# Patient Record
Sex: Female | Born: 1995 | Race: Black or African American | Hispanic: No | Marital: Single | Smoking: Never smoker
Health system: Southern US, Community
[De-identification: ages and names within clinical notes are randomized; demographics above are authoritative.]

---

## 2014-02-24 ENCOUNTER — Other Ambulatory Visit (HOSPITAL_COMMUNITY): Payer: Self-pay | Admitting: Nurse Practitioner

## 2014-02-24 DIAGNOSIS — Z3689 Encounter for other specified antenatal screening: Secondary | ICD-10-CM

## 2014-02-28 ENCOUNTER — Ambulatory Visit (HOSPITAL_COMMUNITY)
Admission: RE | Admit: 2014-02-28 | Discharge: 2014-02-28 | Disposition: A | Discharge status: Home / Self Care | Payer: Medicaid Other | Source: Ambulatory Visit | Attending: Nurse Practitioner | Admitting: Nurse Practitioner

## 2014-02-28 DIAGNOSIS — Z3A21 21 weeks gestation of pregnancy: Secondary | ICD-10-CM | POA: Insufficient documentation

## 2014-02-28 DIAGNOSIS — Z36 Encounter for antenatal screening of mother: Secondary | ICD-10-CM | POA: Diagnosis not present

## 2014-02-28 DIAGNOSIS — Z3689 Encounter for other specified antenatal screening: Secondary | ICD-10-CM

## 2014-05-19 ENCOUNTER — Other Ambulatory Visit (HOSPITAL_COMMUNITY): Payer: Self-pay | Admitting: Nurse Practitioner

## 2014-05-19 DIAGNOSIS — IMO0002 Reserved for concepts with insufficient information to code with codable children: Secondary | ICD-10-CM

## 2014-05-19 DIAGNOSIS — Z0489 Encounter for examination and observation for other specified reasons: Secondary | ICD-10-CM

## 2014-05-23 ENCOUNTER — Other Ambulatory Visit (HOSPITAL_COMMUNITY): Payer: Self-pay | Admitting: Nurse Practitioner

## 2014-05-23 ENCOUNTER — Ambulatory Visit (HOSPITAL_COMMUNITY)
Admission: RE | Admit: 2014-05-23 | Discharge: 2014-05-23 | Disposition: A | Discharge status: Home / Self Care | Payer: Medicaid Other | Source: Ambulatory Visit | Attending: Nurse Practitioner | Admitting: Nurse Practitioner

## 2014-05-23 DIAGNOSIS — Z0489 Encounter for examination and observation for other specified reasons: Secondary | ICD-10-CM

## 2014-05-23 DIAGNOSIS — Z3A33 33 weeks gestation of pregnancy: Secondary | ICD-10-CM | POA: Diagnosis not present

## 2014-05-23 DIAGNOSIS — IMO0002 Reserved for concepts with insufficient information to code with codable children: Secondary | ICD-10-CM | POA: Insufficient documentation

## 2014-05-23 DIAGNOSIS — Z36 Encounter for antenatal screening of mother: Secondary | ICD-10-CM | POA: Insufficient documentation

## 2017-01-04 ENCOUNTER — Other Ambulatory Visit: Payer: Self-pay

## 2017-01-04 ENCOUNTER — Emergency Department (HOSPITAL_COMMUNITY): Payer: Self-pay

## 2017-01-04 ENCOUNTER — Emergency Department (HOSPITAL_COMMUNITY)
Admission: EM | Admit: 2017-01-04 | Discharge: 2017-01-05 | Disposition: A | Discharge status: Home / Self Care | Payer: Self-pay | Attending: Emergency Medicine | Admitting: Emergency Medicine

## 2017-01-04 ENCOUNTER — Encounter (HOSPITAL_COMMUNITY): Payer: Self-pay

## 2017-01-04 DIAGNOSIS — M549 Dorsalgia, unspecified: Secondary | ICD-10-CM | POA: Insufficient documentation

## 2017-01-04 DIAGNOSIS — B349 Viral infection, unspecified: Secondary | ICD-10-CM | POA: Insufficient documentation

## 2017-01-04 LAB — I-STAT TROPONIN, ED: TROPONIN I, POC: 0 ng/mL (ref 0.00–0.08)

## 2017-01-04 LAB — LIPASE, BLOOD: LIPASE: 26 U/L (ref 11–51)

## 2017-01-04 LAB — COMPREHENSIVE METABOLIC PANEL
ALT: 16 U/L (ref 14–54)
ANION GAP: 5 (ref 5–15)
AST: 21 U/L (ref 15–41)
Albumin: 3.5 g/dL (ref 3.5–5.0)
Alkaline Phosphatase: 62 U/L (ref 38–126)
BILIRUBIN TOTAL: 0.1 mg/dL — AB (ref 0.3–1.2)
BUN: 9 mg/dL (ref 6–20)
CHLORIDE: 107 mmol/L (ref 101–111)
CO2: 26 mmol/L (ref 22–32)
Calcium: 8.8 mg/dL — ABNORMAL LOW (ref 8.9–10.3)
Creatinine, Ser: 0.76 mg/dL (ref 0.44–1.00)
Glucose, Bld: 86 mg/dL (ref 65–99)
POTASSIUM: 4.2 mmol/L (ref 3.5–5.1)
Sodium: 138 mmol/L (ref 135–145)
TOTAL PROTEIN: 6.4 g/dL — AB (ref 6.5–8.1)

## 2017-01-04 LAB — URINALYSIS, ROUTINE W REFLEX MICROSCOPIC
Bilirubin Urine: NEGATIVE
Glucose, UA: NEGATIVE mg/dL
Hgb urine dipstick: NEGATIVE
Ketones, ur: NEGATIVE mg/dL
Leukocytes, UA: NEGATIVE
Nitrite: NEGATIVE
Protein, ur: NEGATIVE mg/dL
Specific Gravity, Urine: 1.016 (ref 1.005–1.030)
pH: 5 (ref 5.0–8.0)

## 2017-01-04 LAB — CBC WITH DIFFERENTIAL/PLATELET
BASOS ABS: 0 10*3/uL (ref 0.0–0.1)
BASOS PCT: 1 %
EOS PCT: 3 %
Eosinophils Absolute: 0.3 10*3/uL (ref 0.0–0.7)
HCT: 33.8 % — ABNORMAL LOW (ref 36.0–46.0)
Hemoglobin: 11.1 g/dL — ABNORMAL LOW (ref 12.0–15.0)
LYMPHS PCT: 17 %
Lymphs Abs: 1.5 10*3/uL (ref 0.7–4.0)
MCH: 23 pg — ABNORMAL LOW (ref 26.0–34.0)
MCHC: 32.8 g/dL (ref 30.0–36.0)
MCV: 70 fL — AB (ref 78.0–100.0)
MONO ABS: 0.7 10*3/uL (ref 0.1–1.0)
Monocytes Relative: 8 %
Neutro Abs: 6.4 10*3/uL (ref 1.7–7.7)
Neutrophils Relative %: 71 %
PLATELETS: 348 10*3/uL (ref 150–400)
RBC: 4.83 MIL/uL (ref 3.87–5.11)
RDW: 14.5 % (ref 11.5–15.5)
WBC: 8.9 10*3/uL (ref 4.0–10.5)

## 2017-01-04 LAB — I-STAT BETA HCG BLOOD, ED (MC, WL, AP ONLY)

## 2017-01-04 MED ORDER — OXYCODONE-ACETAMINOPHEN 5-325 MG PO TABS
1.0000 | ORAL_TABLET | Freq: Once | ORAL | Status: AC
Start: 2017-01-04 — End: 2017-01-05
  Administered 2017-01-05: 1 via ORAL
  Filled 2017-01-04: qty 1

## 2017-01-04 NOTE — ED Provider Notes (Signed)
MSE was initiated and I personally evaluated the patient and placed orders (if any) at  7:00 PM on January 04, 2017.  21 year old female presenting with acute on chronic bilateral upper back pain that has been worsening. She states that she is it concerned that it is a complication of a previous epidural.  She also complains of sharp, non-radiating central chest pain with associated dyspnea that began 2 days ago.  She reports episodes have been brought on while she was at work lifting boxes and improve after she sits down and drinks water and rests. Febrile to 101 at home since yesterday. She states that she does not feel like the back pain is associated with her CP.    The patient appears stable so that the remainder of the MSE may be completed by another provider.   Barkley BoardsMcDonald, Ishika Chesterfield A, PA-C 01/04/17 1905    Nira Connardama, Pedro Eduardo, MD 01/07/17 [phone]

## 2017-01-04 NOTE — ED Triage Notes (Addendum)
Pt states she has been having back pain X2 days. She states some chest pain, denies currently. Pt denies any cough, intermittent shortness of breath with pain. Skin warm and dry. No distress noted.

## 2017-01-20 NOTE — ED Provider Notes (Signed)
[facility] Provider Note   CSN: [id number] Arrival date & time: 01/04/17  1825     History   Chief Complaint Chief Complaint  Patient presents with  . Back Pain    HPI Rebekah Buckley is a 21 y.o. female.  HPI   21 year old female with back pain.  Upper back.  Acute on chronic.  The past several days.  Worse with certain movements.  She is additionally complaining of some sharp central chest pain which began shortly later.  No coughing.  Subjective fever.  No unusual leg pain or swelling.  No past medical history on file.  Patient Active Problem List   Diagnosis Date Noted  . Evaluate anatomy not seen on prior sonogram   . [redacted] weeks gestation of pregnancy   . Encounter for fetal anatomic survey   . [redacted] weeks gestation of pregnancy     The histories are not reviewed yet. Please review them in the "History" navigator section and refresh this SmartLink.  OB History    No data available       Home Medications    Prior to Admission medications   Not on File    Family History No family history on file.  Social History Social History   Tobacco Use  . Smoking status: Never Smoker  . Smokeless tobacco: Never Used  Substance Use Topics  . Alcohol use: No    Frequency: Never  . Drug use: Not on file     Allergies   Patient has no known allergies.   Review of Systems Review of Systems   Physical Exam Updated Vital Signs BP 131/82 (BP Location: Right Arm)   Pulse 98   Temp 100.1 F (37.8 C) (Oral)   Resp 18   LMP 12/24/2016 (Within Days)   SpO2 100%   Physical Exam  Constitutional: She is oriented to person, place, and time. She appears well-developed and well-nourished. No distress.  HENT:  Head: Normocephalic and atraumatic.  Eyes: Conjunctivae are normal. Right eye exhibits no discharge. Left eye exhibits no discharge.  Neck: Neck supple.  Cardiovascular: Normal rate, regular rhythm and normal heart  sounds. Exam reveals no gallop and no friction rub.  No murmur heard. Pulmonary/Chest: Effort normal and breath sounds normal. No respiratory distress.  Abdominal: Soft. She exhibits no distension. There is no tenderness.  Musculoskeletal: She exhibits no edema or tenderness.  Mild tenderness paraspinally into the scapular region thoracic back bilaterally.  No overlying skin changes.  Lower extremities symmetric as compared to each other. No calf tenderness. Negative Homan's. No palpable cords.   Neurological: She is alert and oriented to person, place, and time.  Strength is 5 out of 5 bilateral upper extremities.  Able to sit up and get on stretcher and back down without any apparent difficulty normal-appearing gait.  Skin: Skin is warm and dry.  Psychiatric: She has a normal mood and affect. Her behavior is normal. Thought content normal.  Nursing note and vitals reviewed.    ED Treatments / Results  Labs (all labs ordered are listed, but only abnormal results are displayed) Labs Reviewed  URINALYSIS, ROUTINE W REFLEX MICROSCOPIC - Abnormal; Notable for the following components:      Result Value   APPearance HAZY (*)    All other components within normal limits  CBC WITH DIFFERENTIAL/PLATELET - Abnormal; Notable for the following components:   Hemoglobin 11.1 (*)    HCT 33.8 (*)    MCV 70.0 (*)  MCH 23.0 (*)    All other components within normal limits  COMPREHENSIVE METABOLIC PANEL - Abnormal; Notable for the following components:   Calcium 8.8 (*)    Total Protein 6.4 (*)    Total Bilirubin 0.1 (*)    All other components within normal limits  LIPASE, BLOOD  I-STAT TROPONIN, ED  I-STAT BETA HCG BLOOD, ED ([facility], [facility], [facility] ONLY)    EKG  EKG Interpretation  Date/Time:  Wednesday January 04 2017 18:33:49 EST Ventricular Rate:  77 PR Interval:  180 QRS Duration: 90 QT Interval:  364 QTC Calculation: 411 R Axis:   91 Text Interpretation:  Unusual P axis, possible  ectopic atrial rhythm Rightward axis Nonspecific ST abnormality Abnormal ECG No old tracing to compare Confirmed by Dione BoozeGlick, David ([id number]) on 01/04/2017 11:02:44 PM Also confirmed by Dione BoozeGlick, David ([id number]), editor Elita QuickWatlington, Beverly ([id number])  on 01/05/2017 7:28:53 AM       Radiology No results found.   Dg Chest 2 View  Result Date: 01/04/2017 CLINICAL DATA:  Upper back pain for 3 days EXAM: CHEST  2 VIEW COMPARISON:  None. FINDINGS: Normal mediastinum and cardiac silhouette. Normal pulmonary vasculature. No evidence of effusion, infiltrate, or pneumothorax. No acute bony abnormality. IMPRESSION: Normal chest radiograph. Electronically Signed   By: Genevive BiStewart  Edmunds M.D.   On: 01/04/2017 20:04    Procedures Procedures (including critical care time)  Medications Ordered in ED Medications  oxyCODONE-acetaminophen (PERCOCET/ROXICET) 5-325 MG per tablet 1 tablet (1 tablet Oral Given 01/05/17 0011)     Initial Impression / Assessment and Plan / ED Course  I have reviewed the triage vital signs and the nursing notes.  Pertinent labs & imaging results that were available during my care of the patient were reviewed by me and considered in my medical decision making (see chart for details).     21 year old female with chest pain which is atypical for ACS.  Doubt PE, dissection or other emergent pathology.  Her lungs are clear.  She has no increased work of breathing.  Oxygen saturations are normal on room air.  Chest x-ray is clear.  Nonfocal neurological examination.  Plan symptom medic treatment for likely viral illness and musculoskeletal back pain.  Final Clinical Impressions(s) / ED Diagnoses   Final diagnoses:  Viral illness  Back pain, unspecified back location, unspecified back pain laterality, unspecified chronicity    ED Discharge Orders    None       Raeford RazorKohut, Octa Uplinger, MD 01/20/17 [phone]

## 2019-04-20 IMAGING — CR DG CHEST 2V
2 series · 2 of 2 positions shown · non-contrast
Comparison: None.

CLINICAL DATA: Upper back pain for 3 days

EXAM:
CHEST  2 VIEW

[chest pa]
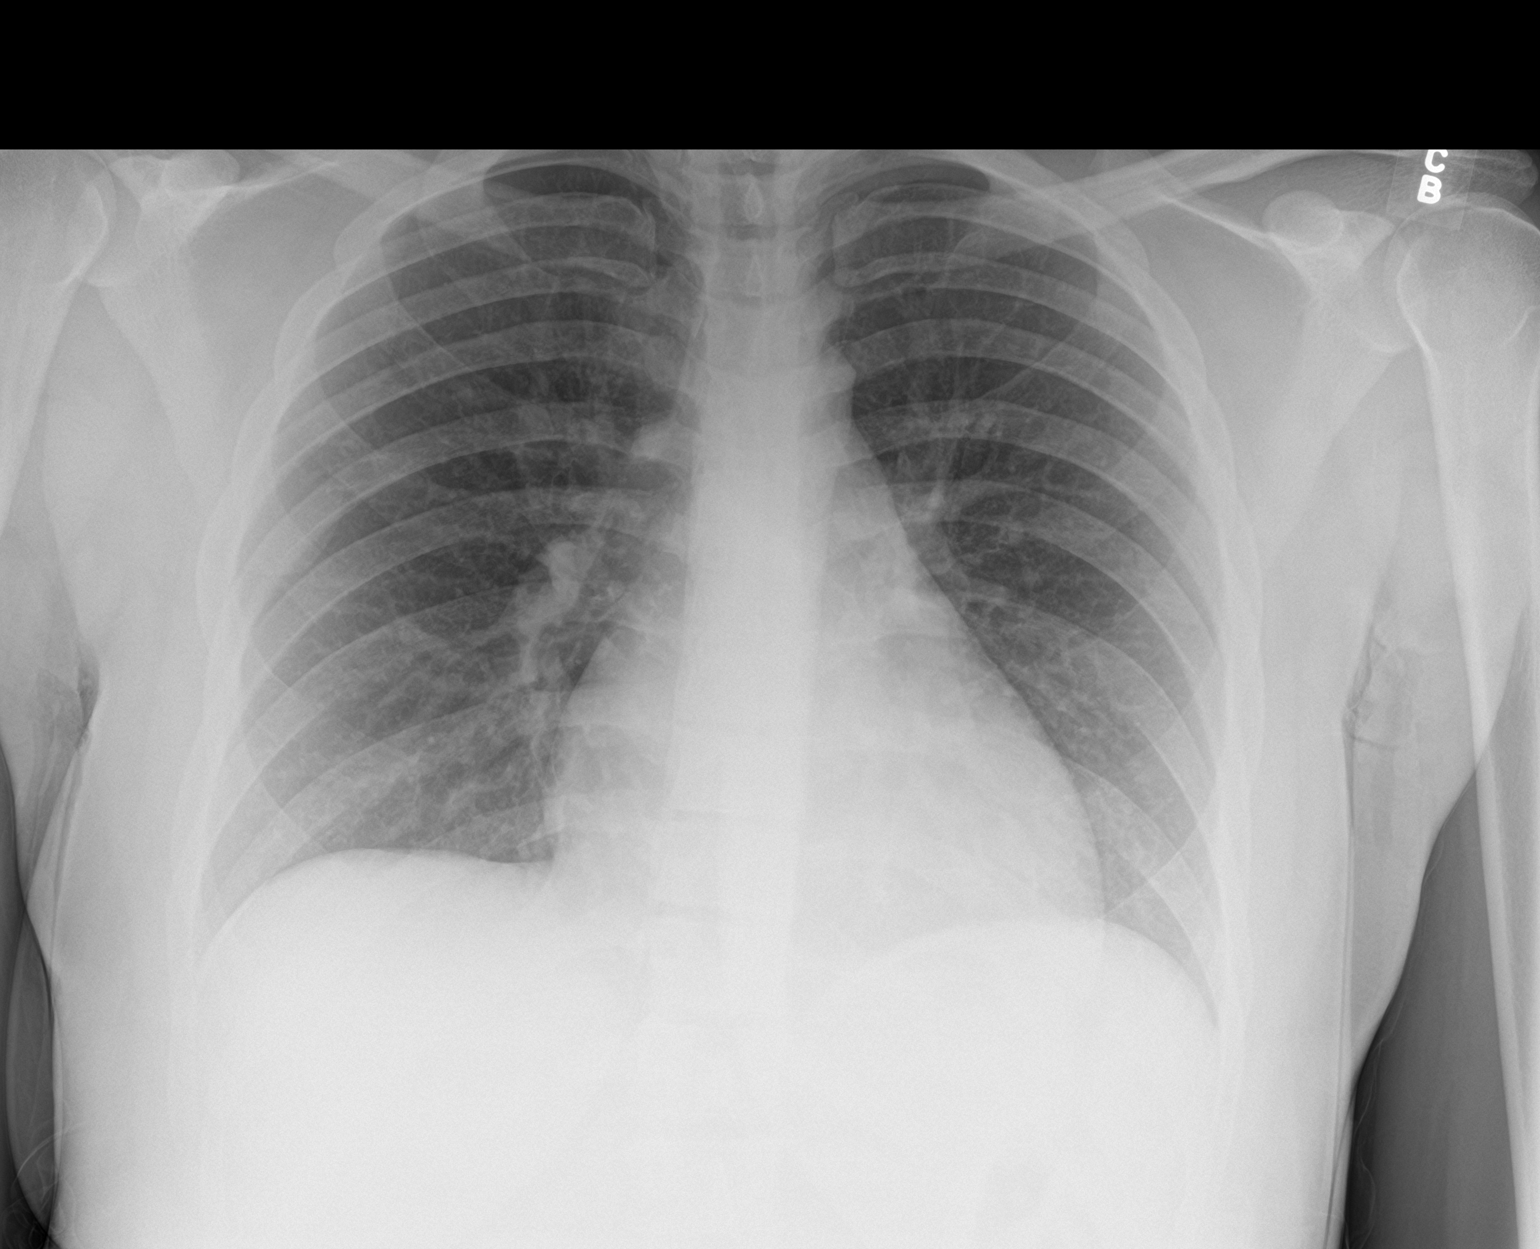

[chest lat]
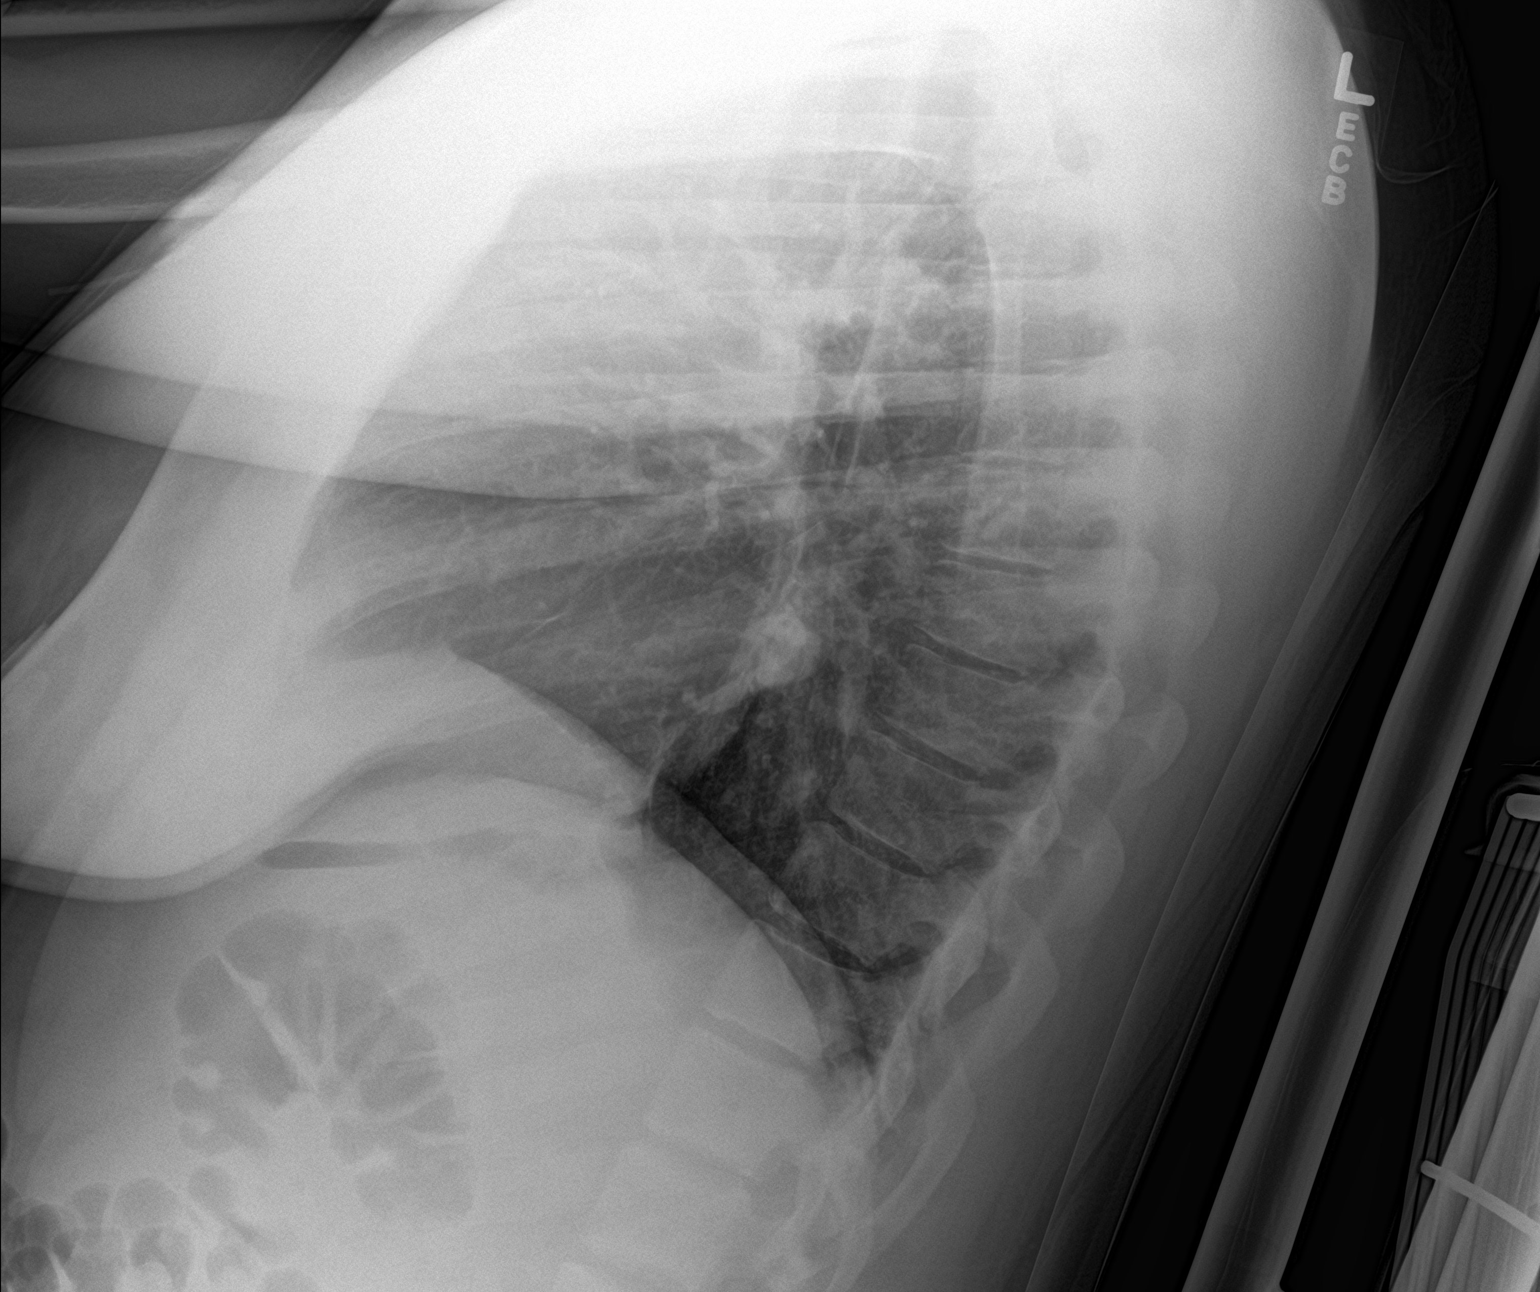

[2 of 2 positions shown; findings below may reference images not displayed]

FINDINGS: Normal mediastinum and cardiac silhouette. Normal pulmonary
vasculature. No evidence of effusion, infiltrate, or pneumothorax.
No acute bony abnormality.
IMPRESSION: Normal chest radiograph.
